# Patient Record
Sex: Female | Born: 2000 | Race: White | Hispanic: No | Marital: Single
Health system: Southern US, Community
[De-identification: ages and names within clinical notes are randomized; demographics above are authoritative.]

---
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
## 2019-05-30 ENCOUNTER — Ambulatory Visit: Payer: BLUE CROSS/BLUE SHIELD | Attending: Internal Medicine

## 2019-05-30 DIAGNOSIS — Z20822 Contact with and (suspected) exposure to covid-19: Secondary | ICD-10-CM

## 2019-05-31 LAB — NOVEL CORONAVIRUS, NAA: SARS-CoV-2, NAA: NOT DETECTED
